# Patient Record
Sex: Male | Born: 1975 | Race: White | Hispanic: No | Marital: Single | Smoking: Current every day smoker
Health system: Southern US, Community
[De-identification: ages and names within clinical notes are randomized; demographics above are authoritative.]

## PROBLEM LIST (undated history)

## (undated) HISTORY — PX: HERNIA REPAIR: SHX51

## (undated) HISTORY — PX: NOSE SURGERY: SHX723

---

## 2015-11-11 ENCOUNTER — Encounter (HOSPITAL_COMMUNITY): Payer: Self-pay | Admitting: Emergency Medicine

## 2015-11-11 ENCOUNTER — Emergency Department (HOSPITAL_COMMUNITY)
Admission: EM | Admit: 2015-11-11 | Discharge: 2015-11-11 | Disposition: A | Discharge status: Home / Self Care | Payer: Self-pay | Attending: Emergency Medicine | Admitting: Emergency Medicine

## 2015-11-11 DIAGNOSIS — M25562 Pain in left knee: Secondary | ICD-10-CM | POA: Insufficient documentation

## 2015-11-11 DIAGNOSIS — F17210 Nicotine dependence, cigarettes, uncomplicated: Secondary | ICD-10-CM | POA: Insufficient documentation

## 2015-11-11 MED ORDER — PREDNISONE 20 MG PO TABS
40.0000 mg | ORAL_TABLET | Freq: Once | ORAL | Status: AC
  Administered 2015-11-11: 40 mg via ORAL
  Filled 2015-11-11: qty 2

## 2015-11-11 MED ORDER — DEXAMETHASONE 4 MG PO TABS: 4.0000 mg | ORAL_TABLET | Freq: Two times a day (BID) | ORAL | 0 refills | Status: AC

## 2015-11-11 MED ORDER — DICLOFENAC SODIUM 75 MG PO TBEC: 75.0000 mg | DELAYED_RELEASE_TABLET | Freq: Two times a day (BID) | ORAL | 0 refills | Status: AC

## 2015-11-11 MED ORDER — IBUPROFEN 800 MG PO TABS
800.0000 mg | ORAL_TABLET | Freq: Once | ORAL | Status: AC
  Administered 2015-11-11: 800 mg via ORAL
  Filled 2015-11-11: qty 1

## 2015-11-11 NOTE — ED Provider Notes (Signed)
[facility]PT Provider Note   CSN: [id number] Arrival date & time: 11/11/15  1558  First Provider Contact: [id number]      History   Chief Complaint Chief Complaint  Patient presents with  . Knee Pain    HPI Benjamin Wells is a 40 y.o. male.  Patient presents to the emergency department with a complaint of left knee pain. He is 40 years old on. The patient states that approximately a year ago he injured his knee while playing sports with his children on. He was evaluated by an emergency room in the [place] area. He was told that he had some cartilage issues. The patient states that he never sought any additional evaluation or management of this problem. On yesterday the patient states that he began to have increased pain in his knee, he noticed swelling present on. Today he states that he could not bear weight on it on at first. He was later able to get out of the bed, but had pain when attempting to flex or extend the knee. He presents to the emergency department now for evaluation on, and assistance with this problem. He's not had any previous operations or procedures involving the left knee. He states he has taken some over-the-counter anti-inflammatory medication, but this is not helping very much.      History reviewed. No pertinent past medical history.  There are no active problems to display for this patient.   Past Surgical History:  Procedure Laterality Date  . HERNIA REPAIR    . NOSE SURGERY         Home Medications    Prior to Admission medications   Not on File    Family History History reviewed. No pertinent family history.  Social History Social History  Substance Use Topics  . Smoking status: Current Every Day Smoker    Packs/day: 1.00    Types: Cigarettes  . Smokeless tobacco: Never Used  . Alcohol use 1.2 oz/week    2 Cans of beer per week     Allergies   Sulfur   Review of Systems Review of Systems  Constitutional: Negative for  activity change.       All ROS Neg except as noted in HPI  HENT: Negative for nosebleeds.   Eyes: Negative for photophobia and discharge.  Respiratory: Negative for cough, shortness of breath and wheezing.   Cardiovascular: Negative for chest pain and palpitations.  Gastrointestinal: Negative for abdominal pain and blood in stool.  Genitourinary: Negative for dysuria, frequency and hematuria.  Musculoskeletal: Positive for arthralgias, gait problem and joint swelling. Negative for back pain and neck pain.  Skin: Negative.   Neurological: Negative for dizziness, seizures and speech difficulty.  Psychiatric/Behavioral: Negative for confusion and hallucinations.  All other systems reviewed and are negative.    Physical Exam Updated Vital Signs BP 131/93 (BP Location: Left Arm)   Pulse 99   Temp 98.3 F (36.8 C) (Oral)   Resp 20   Ht 6' (1.829 m)   Wt 95.3 kg   SpO2 95%   BMI 28.48 kg/m   Physical Exam  Constitutional: He is oriented to person, place, and time. He appears well-developed and well-nourished.  Non-toxic appearance.  HENT:  Head: Normocephalic.  Right Ear: Tympanic membrane and external ear normal.  Left Ear: Tympanic membrane and external ear normal.  Eyes: EOM and lids are normal. Pupils are equal, round, and reactive to light.  Neck: Normal range of motion. Neck supple. Carotid bruit is not  present.  Cardiovascular: Normal rate, regular rhythm, normal heart sounds, intact distal pulses and normal pulses.   Pulmonary/Chest: Breath sounds normal. No respiratory distress.  Abdominal: Soft. Bowel sounds are normal. There is no tenderness. There is no guarding.  Musculoskeletal: He exhibits tenderness.       Left knee: He exhibits decreased range of motion and swelling. He exhibits no deformity and no erythema. Tenderness found. Medial joint line and lateral joint line tenderness noted.  Lymphadenopathy:       Head (right side): No submandibular adenopathy present.         Head (left side): No submandibular adenopathy present.    He has no cervical adenopathy.  Neurological: He is alert and oriented to person, place, and time. He has normal strength. No cranial nerve deficit or sensory deficit.  Skin: Skin is warm and dry.  Psychiatric: He has a normal mood and affect. His speech is normal.  Nursing note and vitals reviewed.    ED Treatments / Results  Labs (all labs ordered are listed, but only abnormal results are displayed) Labs Reviewed - No data to display  EKG  EKG Interpretation None       Radiology No results found.  Procedures Procedures (including critical care time)  Medications Ordered in ED Medications - No data to display   Initial Impression / Assessment and Plan / ED Course  I have reviewed the triage vital signs and the nursing notes.  Pertinent labs & imaging results that were available during my care of the patient were reviewed by me and considered in my medical decision making (see chart for details).  Clinical Course    **I have reviewed nursing notes, vital signs, and all appropriate lab and imaging results for this patient.*  Final Clinical Impressions(s) / ED Diagnoses Patient presents with a history of a "knee issues". The examination is negative for septic joint, dislocation, evidence of fracture. Suspect the patient has some internal derangement involving the knee. The plan at this time is for the patient to be placed in a knee immobilizer, fitted with crutches, he will be treated with ibuprofen and Decadron on. He is referred to Dr. Romeo Apple for orthopedic evaluation.    Final diagnoses:  None    New Prescriptions New Prescriptions   No medications on file     Ivery Quale, PA-C 11/11/15 1709    Zadie Rhine, MD 11/11/15 2256

## 2015-11-11 NOTE — ED Triage Notes (Signed)
PT c/o left knee pain without any new injury x2 days. PT ambulatory in triage with home brace in place.

## 2015-11-11 NOTE — Discharge Instructions (Signed)
Please use the knee immobilizer and crutches until seen by the orthopedic specialist. It is important to see the orthopedic specialist as soon as possible concerning your knee. Please use Decadron and diclofenac daily with food.

## 2015-12-14 ENCOUNTER — Emergency Department (HOSPITAL_COMMUNITY): Payer: Self-pay

## 2015-12-14 ENCOUNTER — Emergency Department (HOSPITAL_COMMUNITY)
Admission: EM | Admit: 2015-12-14 | Discharge: 2015-12-14 | Disposition: A | Discharge status: Home / Self Care | Payer: Self-pay | Attending: Emergency Medicine | Admitting: Emergency Medicine

## 2015-12-14 ENCOUNTER — Encounter (HOSPITAL_COMMUNITY): Payer: Self-pay | Admitting: Emergency Medicine

## 2015-12-14 DIAGNOSIS — Y998 Other external cause status: Secondary | ICD-10-CM | POA: Insufficient documentation

## 2015-12-14 DIAGNOSIS — Y9364 Activity, baseball: Secondary | ICD-10-CM | POA: Insufficient documentation

## 2015-12-14 DIAGNOSIS — S6992XA Unspecified injury of left wrist, hand and finger(s), initial encounter: Secondary | ICD-10-CM | POA: Insufficient documentation

## 2015-12-14 DIAGNOSIS — F17210 Nicotine dependence, cigarettes, uncomplicated: Secondary | ICD-10-CM | POA: Insufficient documentation

## 2015-12-14 DIAGNOSIS — W1839XA Other fall on same level, initial encounter: Secondary | ICD-10-CM | POA: Insufficient documentation

## 2015-12-14 DIAGNOSIS — Y929 Unspecified place or not applicable: Secondary | ICD-10-CM | POA: Insufficient documentation

## 2015-12-14 MED ORDER — IBUPROFEN 800 MG PO TABS
800.0000 mg | ORAL_TABLET | Freq: Once | ORAL | Status: AC
  Administered 2015-12-14: 800 mg via ORAL
  Filled 2015-12-14: qty 1

## 2015-12-14 NOTE — Discharge Instructions (Signed)
X-ray of your wrist is negative for fracture. However since you have a lot of tenderness over your scaphoid bone of your wrist, x-rays can sometimes miss fracture and this is why we are splinting you today. Please follow-up with hand surgery listed above for repeat x-rays and reevaluation.  Take ibuprofen and Tylenol as needed for pain control. Continue to ice and keep extremity elevated to keep swelling down.

## 2015-12-14 NOTE — ED Provider Notes (Signed)
[facility] Provider Note   CSN: [id number] Arrival date & time: 12/14/15  0708  By signing my name below, I, Rosario Adie, attest that this documentation has been prepared under the direction and in the presence of Lavera Guise, MD. Electronically Signed: Rosario Adie, ED Scribe. 12/14/15. 8:40 AM.  History   Chief Complaint Chief Complaint  Patient presents with  . Wrist Injury   The history is provided by the patient. No language interpreter was used.   HPI Comments: Benjamin Wells is a right hand dominant 40 y.o. male with no pertinent PMhx, who presents to the Emergency Department complaining of sudden onset, gradually worsening, constant left wrist pain s/p mechanical, ground-level fall that occurred yesterday PM. Pt reports that he was playing basketball at the time of his fall, and fell onto concrete with his left arm outstretched to catch himself. His pain was sustained on impact. Denies LOC or head injury. He states that his pain is exacerbated with movement of his left digits, and that his pain radiates up into his left elbow. No other trauma or injuries. Denies numbness, or any other associated symptoms.   History reviewed. No pertinent past medical history.  There are no active problems to display for this patient.  Past Surgical History:  Procedure Laterality Date  . HERNIA REPAIR    . NOSE SURGERY      Home Medications    Prior to Admission medications   Medication Sig Start Date End Date Taking? Authorizing Provider  dexamethasone (DECADRON) 4 MG tablet Take 1 tablet (4 mg total) by mouth 2 (two) times daily with a meal. 11/11/15   Ivery Quale, PA-C  diclofenac (VOLTAREN) 75 MG EC tablet Take 1 tablet (75 mg total) by mouth 2 (two) times daily. 11/11/15   Ivery Quale, PA-C   Family History History reviewed. No pertinent family history.  Social History Social History  Substance Use Topics  . Smoking status: Current Every Day Smoker   Packs/day: 1.00    Types: Cigarettes  . Smokeless tobacco: Never Used  . Alcohol use 1.2 oz/week    2 Cans of beer per week   Allergies   Sulfur and Sulfa antibiotics  Review of Systems Review of Systems 10/14 systems reviewed and are negative other than those stated in the HPI  Physical Exam Updated Vital Signs BP (!) 161/109 (BP Location: Right Arm)   Pulse 95   Temp 97.8 F (36.6 C) (Oral)   Resp 16   Ht 6' (1.829 m)   Wt 210 lb (95.3 kg)   SpO2 98%   BMI 28.48 kg/m   Physical Exam Physical Exam  Nursing note and vitals reviewed. Constitutional: Well developed, well nourished, non-toxic, and in no acute distress Head: Normocephalic and atraumatic.  Mouth/Throat: Oropharynx is clear and moist.  Neck: Normal range of motion. Neck supple.  Cardiovascular: Normal rate and regular rhythm.  +2 radial pulse of the LUE.  Pulmonary/Chest: Effort normal. Abdominal: Soft.   Musculoskeletal: There is no deformities, but mild soft tissue swelling of the left wrist. Significant TTP of the snuff box.  Neurological: Alert, no facial droop, fluent speech, intact innervation involving the radial, ulnar, and median nerves of the left hand.  Skin: Skin is warm and dry.  Psychiatric: Cooperative  ED Treatments / Results  DIAGNOSTIC STUDIES: Oxygen Saturation is 98% on RA, normal by my interpretation.   COORDINATION OF CARE: 8:39 AM-Discussed next steps with pt. Pt verbalized understanding and is agreeable with  the plan.   Radiology Dg Wrist Complete Left  Result Date: 12/14/2015 CLINICAL DATA:  Fall while playing yesterday.  Swelling, pain. EXAM: LEFT WRIST - COMPLETE 3+ VIEW COMPARISON:  None. FINDINGS: There is no evidence of fracture or dislocation. There is no evidence of arthropathy or other focal bone abnormality. Soft tissues are unremarkable. IMPRESSION: Negative. Electronically Signed   By: Charlett NoseKevin  Dover M.D.   On: 12/14/2015 07:35   Procedures Procedures   Medications  Ordered in ED Medications  ibuprofen (ADVIL,MOTRIN) tablet 800 mg (800 mg Oral Given 12/14/15 0851)    Initial Impression / Assessment and Plan / ED Course  I have reviewed the triage vital signs and the nursing notes.  Pertinent labs & imaging results that were available during my care of the patient were reviewed by me and considered in my medical decision making (see chart for details).  Clinical Course   Presenting after fall onto outstretched left hand yesterday. Left wrist with mild soft tissue swelling but no appreciable deformities. Is neurovascularly intact. X-rays negative for fracture, but he does have reported significant snuffbox tenderness. We'll place an thumb spica splint for question of occult scaphoid fracture. He is given hand surgery follow-up. Strict return and follow-up instructions reviewed. He expressed understanding of all discharge instructions and felt comfortable with the plan of care.   Final Clinical Impressions(s) / ED Diagnoses   Final diagnoses:  Wrist injury, left, initial encounter   New Prescriptions New Prescriptions   No medications on file    I personally performed the services described in this documentation, which was scribed in my presence. The recorded information has been reviewed and is accurate.     Lavera Guiseana Duo Emmilyn Crooke, MD 12/14/15 [phone]

## 2015-12-14 NOTE — ED Notes (Signed)
Ice pack applied. Pt tolerating well.

## 2015-12-14 NOTE — ED Triage Notes (Signed)
Pt states fell while playing basketball last night and caught self with left wrist.

## 2016-05-28 ENCOUNTER — Emergency Department (HOSPITAL_COMMUNITY)
Admission: EM | Admit: 2016-05-28 | Discharge: 2016-05-28 | Disposition: A | Discharge status: Home / Self Care | Payer: Self-pay

## 2017-10-16 IMAGING — DX DG WRIST COMPLETE 3+V*L*
4 series · 4 of 4 positions shown · non-contrast
Comparison: None.

CLINICAL DATA: Fall while playing yesterday.  Swelling, pain.

EXAM:
LEFT WRIST - COMPLETE 3+ VIEW

[wrist pa]
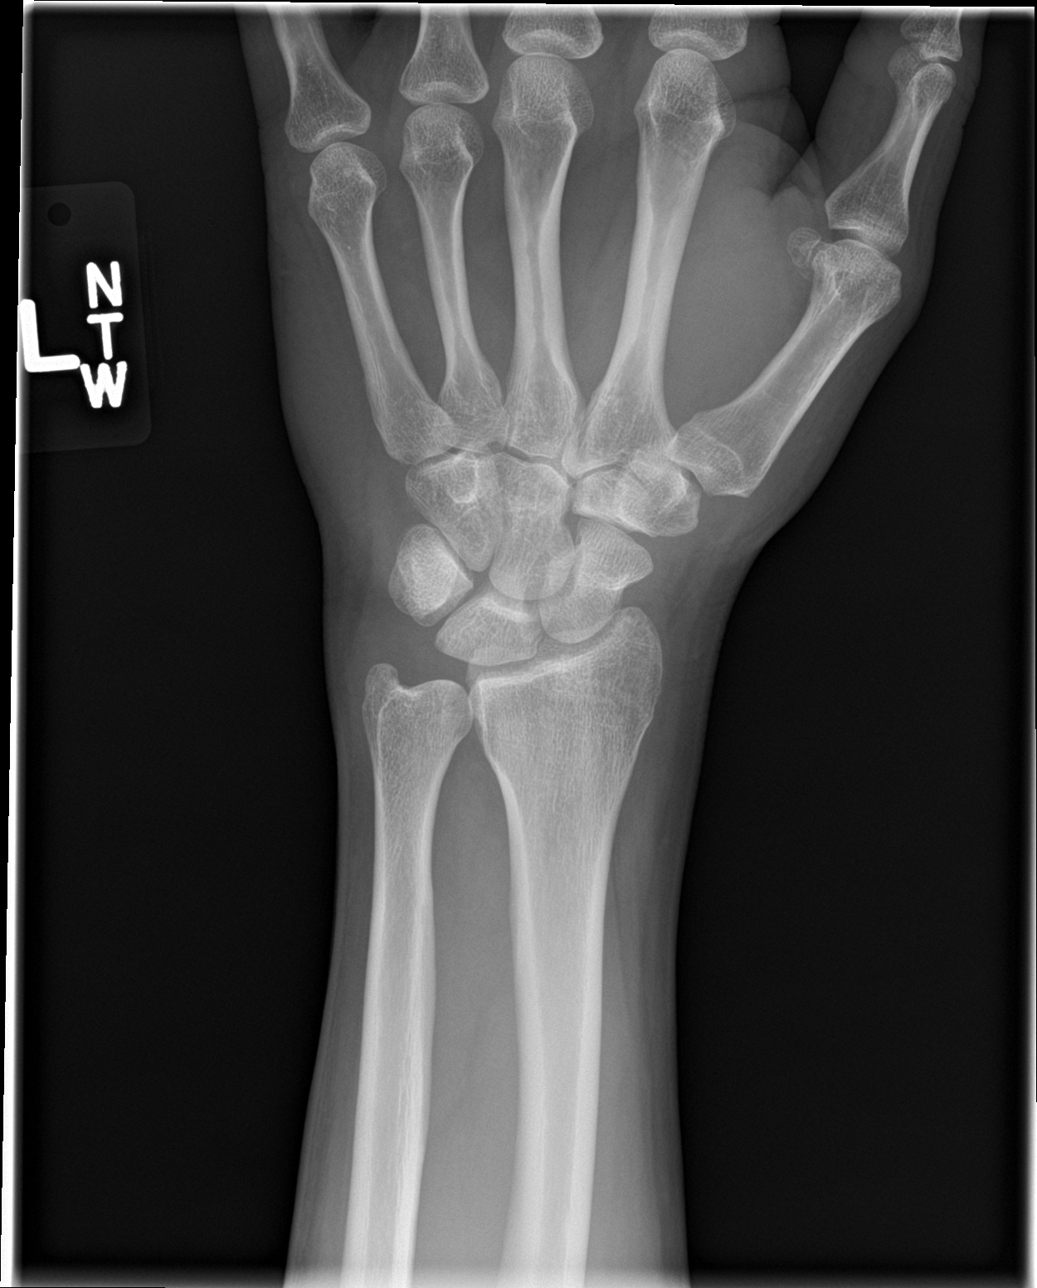

[wrist obl]
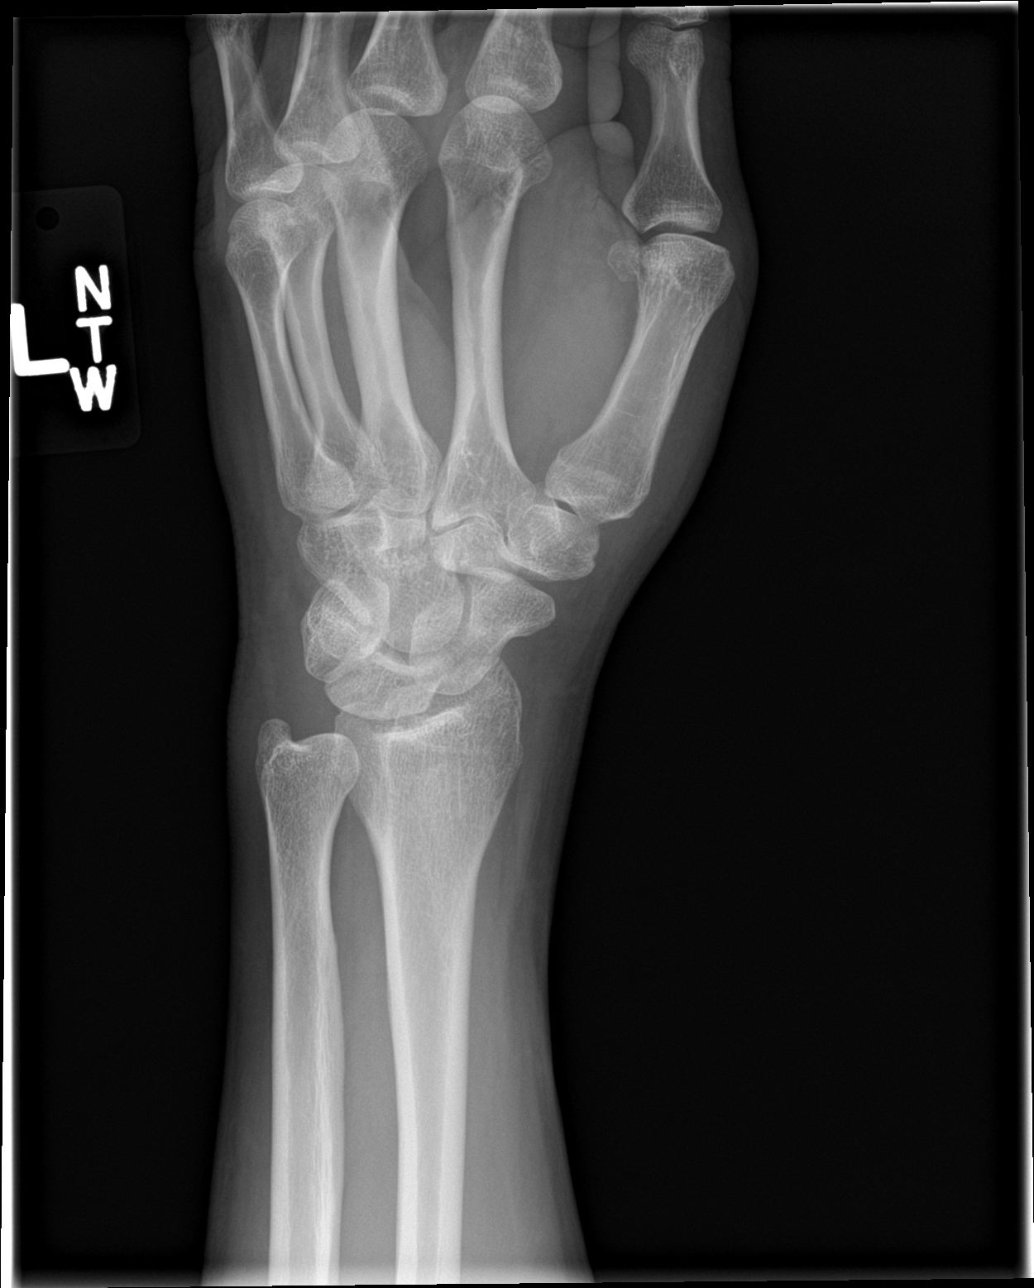

[wrist lat]
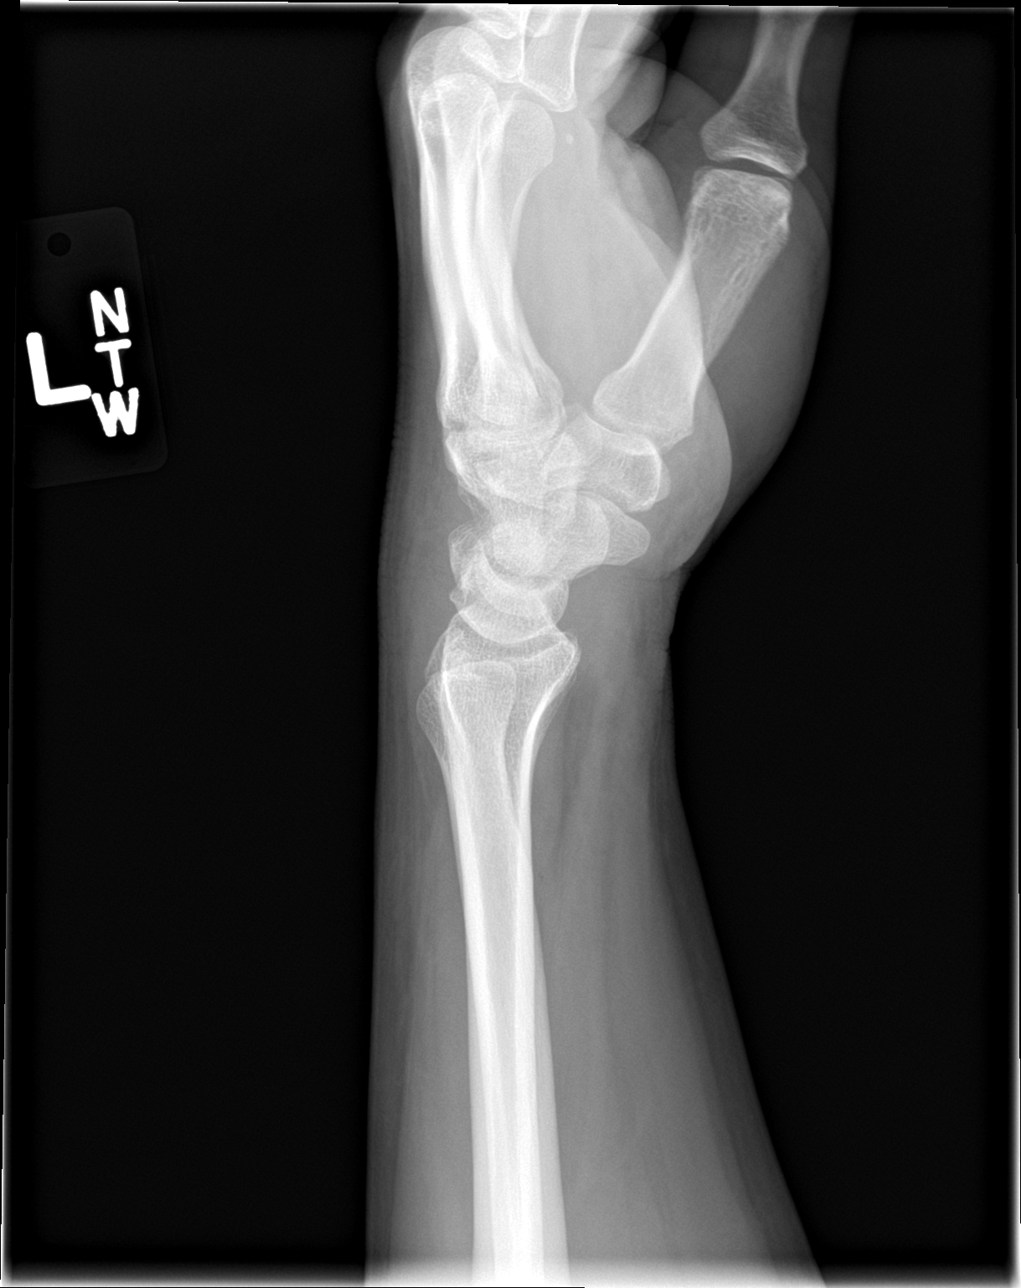

[wrist navicular]
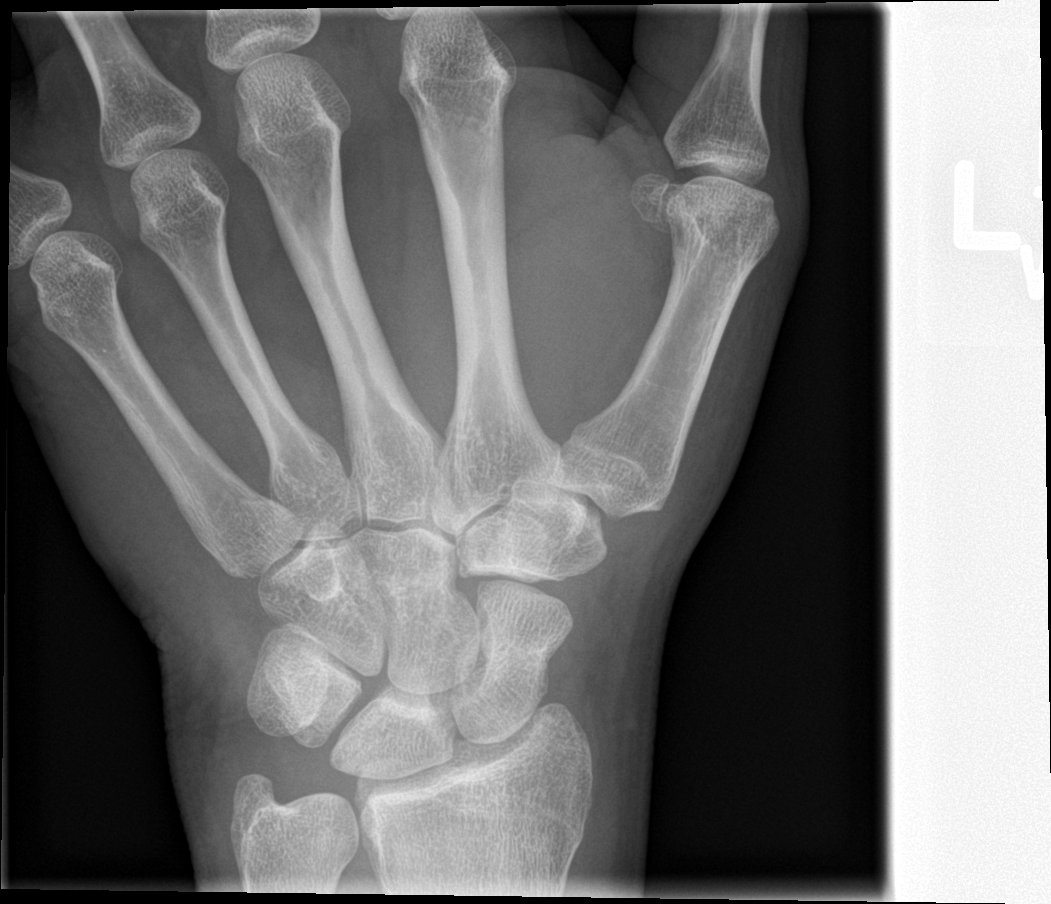

[4 of 4 positions shown; findings below may reference images not displayed]

FINDINGS: There is no evidence of fracture or dislocation. There is no
evidence of arthropathy or other focal bone abnormality. Soft
tissues are unremarkable.
IMPRESSION: Negative.
